# Patient Record
Sex: Male | Born: 1985 | Race: Black or African American | Hispanic: No | Marital: Single | Smoking: Never smoker
Health system: Southern US, Community
[De-identification: ages and names within clinical notes are randomized; demographics above are authoritative.]

## PROBLEM LIST (undated history)

## (undated) DIAGNOSIS — F329 Major depressive disorder, single episode, unspecified: Secondary | ICD-10-CM

## (undated) DIAGNOSIS — F32A Depression, unspecified: Secondary | ICD-10-CM

## (undated) HISTORY — DX: Depression, unspecified: F32.A

## (undated) HISTORY — DX: Major depressive disorder, single episode, unspecified: F32.9

---

## 2009-01-24 ENCOUNTER — Emergency Department (HOSPITAL_COMMUNITY): Admission: EM | Admit: 2009-01-24 | Discharge: 2009-01-24 | Payer: Self-pay | Admitting: Emergency Medicine

## 2009-02-24 ENCOUNTER — Emergency Department (HOSPITAL_COMMUNITY): Admission: EM | Admit: 2009-02-24 | Discharge: 2009-02-24 | Payer: Self-pay | Admitting: Emergency Medicine

## 2009-03-04 ENCOUNTER — Emergency Department (HOSPITAL_COMMUNITY): Admission: EM | Admit: 2009-03-04 | Discharge: 2009-03-04 | Payer: Self-pay | Admitting: Emergency Medicine

## 2009-08-03 ENCOUNTER — Emergency Department (HOSPITAL_COMMUNITY): Admission: EM | Admit: 2009-08-03 | Discharge: 2009-08-03 | Payer: Self-pay | Admitting: Family Medicine

## 2009-09-04 ENCOUNTER — Encounter: Admission: RE | Admit: 2009-09-04 | Discharge: 2009-09-04 | Payer: Self-pay | Admitting: Otolaryngology

## 2010-07-01 IMAGING — CT CT ORBIT/TEMPORAL/IAC W/O CM
4 of 6 series · 16 of 30 positions shown, 17 images · IV contrast (agent unspecified)
Comparison: None

CLINICAL DATA: Cholesteatoma on the left.  Hearing loss.

CT TEMPORAL BONES WITHOUT CONTRAST:
TECHNIQUE: Axial and coronal plane CT imaging of the petrous
temporal bones was performed with thin-collimation image
reconstruction.  No intravenous contrast was administered.
Multiplanar CT image reconstructions were also generated.

[Series 4: ax mag left · axial · 0.20mm/px · z∈[+7,+30]mm · 4 of 126 slices shown]
[im 26/126  bone]
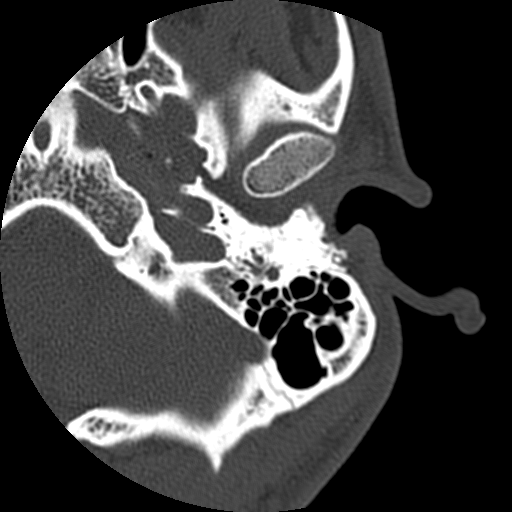
[im 51/126  bone]
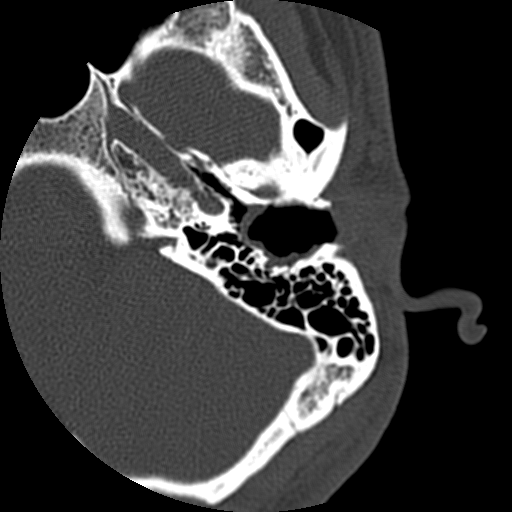
[im 76/126  bone]
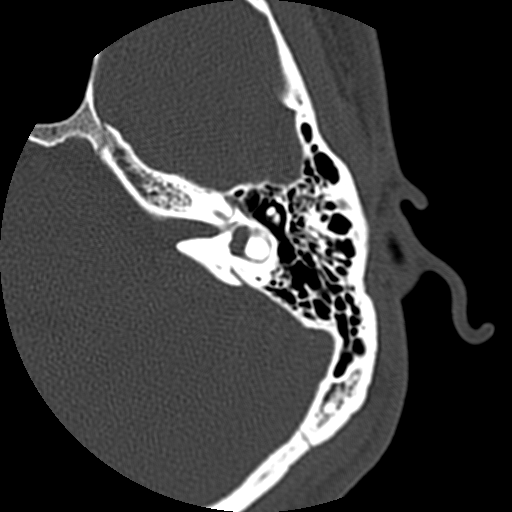
[im 101/126  bone]
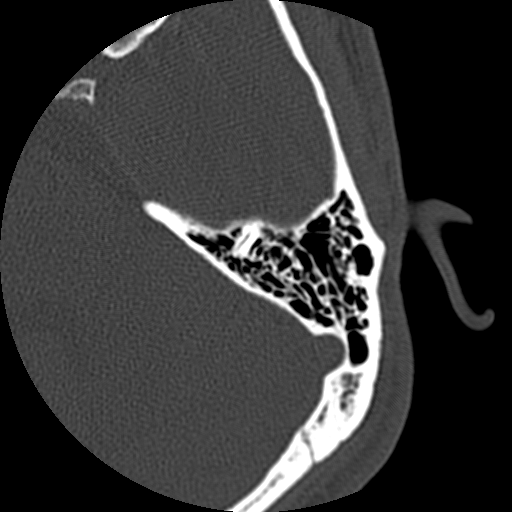

[Series 200: cor bone · coronal · 0.33mm/px · 2 of 81 slices shown, 3 images]
[im 27/81  brain]
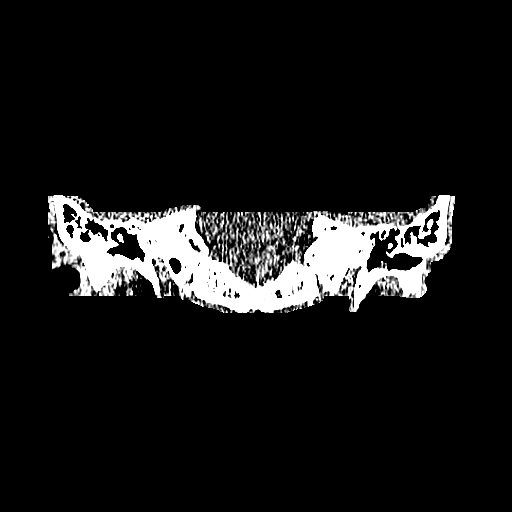
[im 27/81  bone]
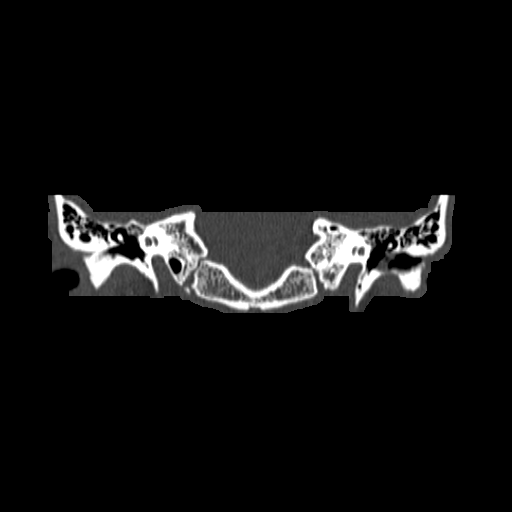
[im 54/81  bone]
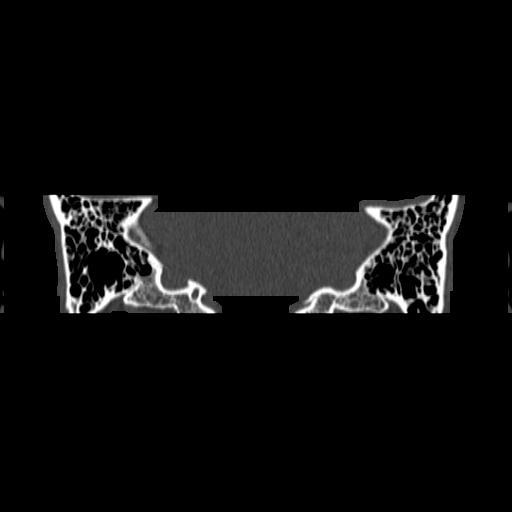

[Series 300: cor mag rt · coronal · 0.20mm/px · 5 of 164 slices shown]
[im 28/164  bone]
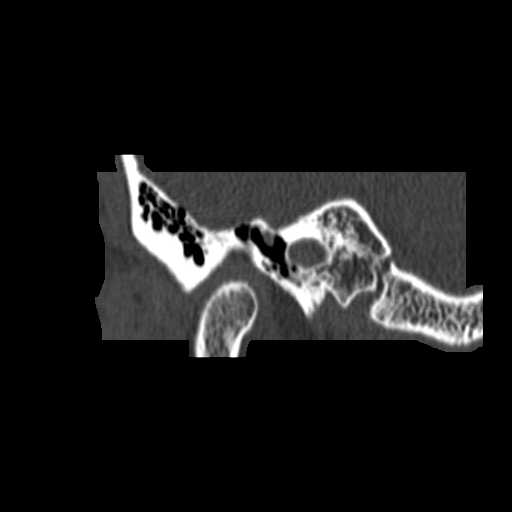
[im 55/164  bone]
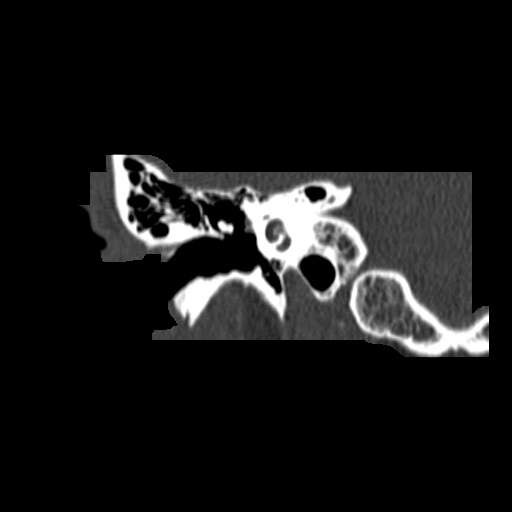
[im 82/164  bone]
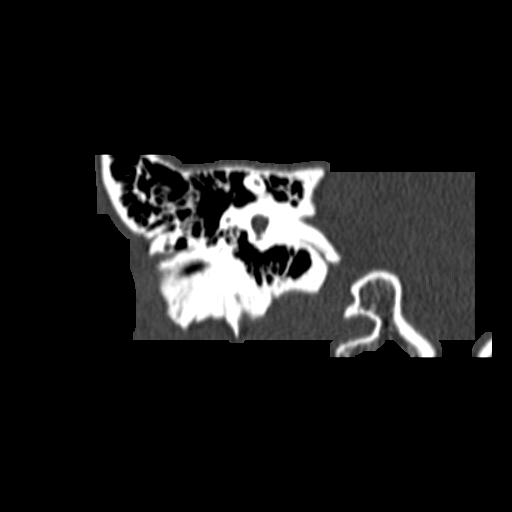
[im 109/164  bone]
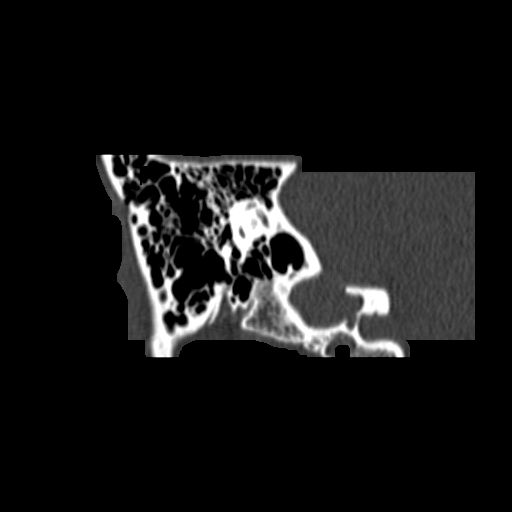
[im 136/164  bone]
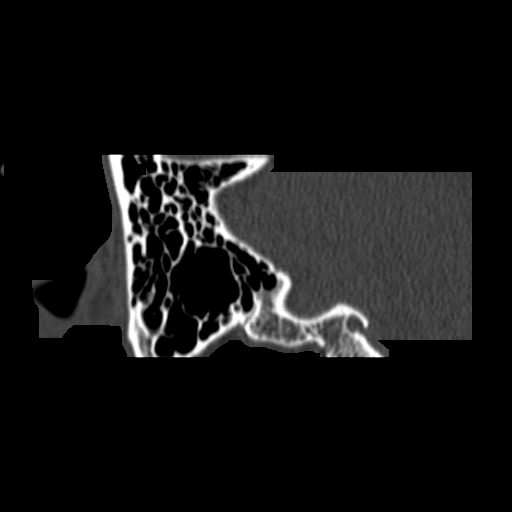

[Series 400: cor mag lt · coronal · 0.20mm/px · 5 of 165 slices shown]
[im 28/165  bone]
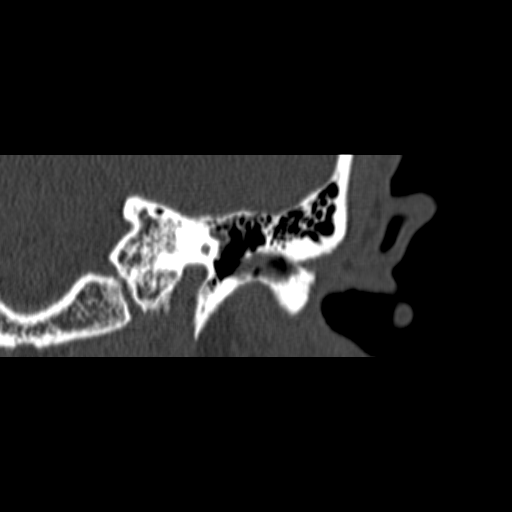
[im 55/165  bone]
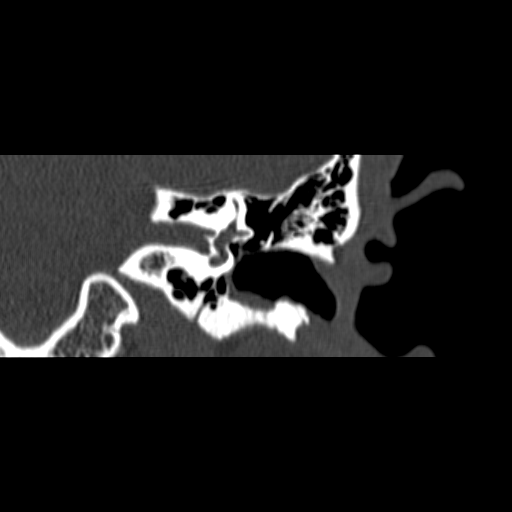
[im 83/165  bone]
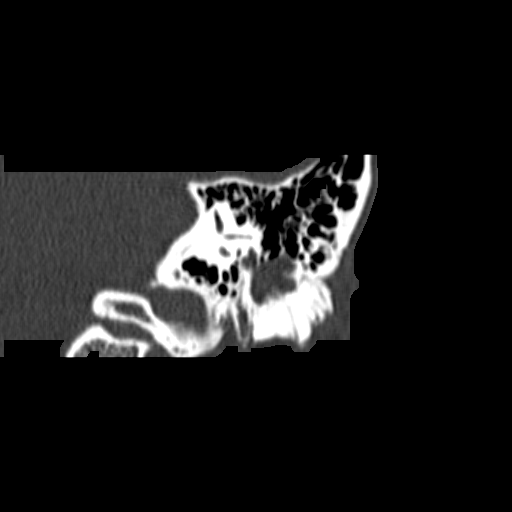
[im 110/165  bone]
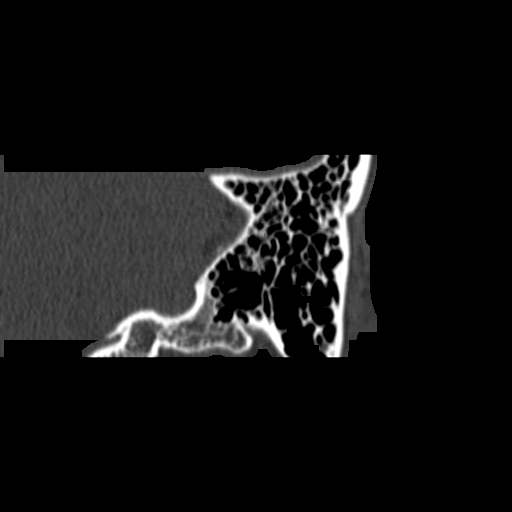
[im 137/165  bone]
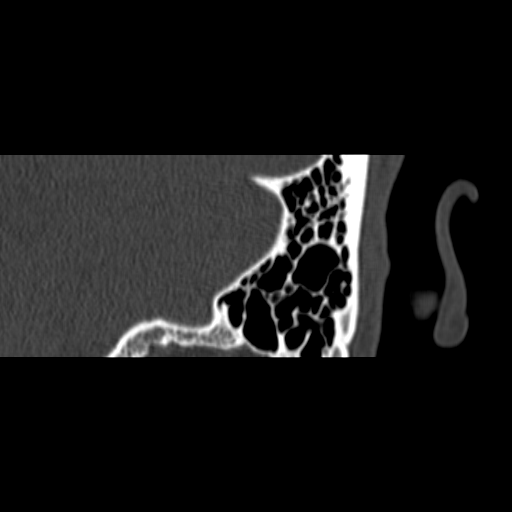

[16 of 30 positions shown; findings below may reference images not displayed]

FINDINGS: On the right, the temporal bone region appears normal.
No fluid in the mastoid air cells.  Middle ear is clear.  Inner ear
structures appear normal.

On the left, the patient has had some sort of extirpation procedure
of the external auditory canal which is circumferentially enlarged
and shows circumferential soft tissue thickening.  There is a
defect in the roof of the temporal fossa.  Tympanic membrane
appears to be intact.  No ossicular destruction.  No fluid in the
middle year or the mastoid air cells.
IMPRESSION: Previous extirpation procedure of the external auditory canal which
is enlarged.  Circumferential soft tissue thickening without
evidence of an actual cholesteatoma or definable polyp.  No fluid
in the middle ear or mastoid air cells.  Bony defect along the
posterior superior wall of the temporal fossa apparently related to
the previous procedure.

## 2010-12-22 LAB — GONOCOCCUS CULTURE

## 2010-12-22 LAB — HERPES SIMPLEX VIRUS CULTURE: Culture: DETECTED

## 2010-12-22 LAB — RAPID STREP SCREEN (MED CTR MEBANE ONLY): Streptococcus, Group A Screen (Direct): NEGATIVE

## 2010-12-22 LAB — CHLAMYDIA TRACHOMATIS CULTURE

## 2013-06-10 ENCOUNTER — Ambulatory Visit (INDEPENDENT_AMBULATORY_CARE_PROVIDER_SITE_OTHER): Payer: BC Managed Care – PPO | Admitting: Emergency Medicine

## 2013-06-10 VITALS — BP 118/74 | HR 70 | Temp 97.9°F | Resp 16 | Ht 67.75 in | Wt 144.6 lb

## 2013-06-10 DIAGNOSIS — Z0000 Encounter for general adult medical examination without abnormal findings: Secondary | ICD-10-CM

## 2013-06-10 LAB — POCT URINALYSIS DIPSTICK
Bilirubin, UA: NEGATIVE
Ketones, UA: NEGATIVE
Leukocytes, UA: NEGATIVE
Nitrite, UA: NEGATIVE
Protein, UA: NEGATIVE

## 2013-06-10 LAB — POCT UA - MICROSCOPIC ONLY
Crystals, Ur, HPF, POC: NEGATIVE
RBC, urine, microscopic: NEGATIVE
WBC, Ur, HPF, POC: NEGATIVE
Yeast, UA: NEGATIVE

## 2013-06-10 NOTE — Progress Notes (Signed)
[facility] [address] [place] [address] [phone]  Date:  06/10/2013   Name:  Brent Lee   DOB:  May 09, 1986   MRN:  [id number]  PCP:  No primary provider on file.    Chief Complaint: Annual Exam   History of Present Illness:  Sylvestre Eguia is a 27 y.o. very pleasant male patient who presents with the following:  For a wellness examination.  Non smoker.  No meds.  Refuses flu shot.  Works for [employer].  Denies other complaint or health concern today.   There are no active problems to display for this patient.   History reviewed. No pertinent past medical history.  History reviewed. No pertinent past surgical history.  History  Substance Use Topics  . Smoking status: Never Smoker   . Smokeless tobacco: Not on file  . Alcohol Use: Yes     Comment: socially    History reviewed. No pertinent family history.  No Known Allergies  Medication list has been reviewed and updated.  No current outpatient prescriptions on file prior to visit.   No current facility-administered medications on file prior to visit.    Review of Systems:  As per HPI, otherwise negative.    Physical Examination: Filed Vitals:   06/10/13 1652  BP: 118/74  Pulse: 70  Temp: 97.9 F (36.6 C)  Resp: 16   Filed Vitals:   06/10/13 1652  Height: 5' 7.75" (1.721 m)  Weight: 144 lb 9.6 oz (65.59 kg)   Body mass index is 22.15 kg/(m^2). Ideal Body Weight: Weight in (lb) to have BMI = 25: 162.9  GEN: WDWN, NAD, Non-toxic, A & O x 3 HEENT: Atraumatic, Normocephalic. Neck supple. No masses, No LAD. Ears and Nose: No external deformity. CV: RRR, No M/G/R. No JVD. No thrill. No extra heart sounds. PULM: CTA B, no wheezes, crackles, rhonchi. No retractions. No resp. distress. No accessory muscle use. ABD: S, NT, ND, +BS. No rebound. No HSM. EXTR: No c/c/e NEURO Normal gait.  PSYCH: Normally interactive. Conversant. Not depressed or anxious appearing.   Calm demeanor.    Assessment and Plan: Wellness examination Labs  Signed,  Phillips Odor, MD

## 2013-06-16 ENCOUNTER — Other Ambulatory Visit (INDEPENDENT_AMBULATORY_CARE_PROVIDER_SITE_OTHER): Payer: BC Managed Care – PPO

## 2013-06-16 DIAGNOSIS — Z0000 Encounter for general adult medical examination without abnormal findings: Secondary | ICD-10-CM

## 2013-06-16 LAB — POCT CBC
HCT, POC: 50.4 % (ref 43.5–53.7)
MCH, POC: 25.8 pg — AB (ref 27–31.2)
MCV: 78.4 fL — AB (ref 80–97)
MID (cbc): 0.6 (ref 0–0.9)
Platelet Count, POC: 206 10*3/uL (ref 142–424)
RBC: 6.43 M/uL — AB (ref 4.69–6.13)
WBC: 5.8 10*3/uL (ref 4.6–10.2)

## 2013-06-16 LAB — COMPREHENSIVE METABOLIC PANEL
AST: 12 U/L (ref 0–37)
Albumin: 4.6 g/dL (ref 3.5–5.2)
BUN: 12 mg/dL (ref 6–23)
CO2: 33 mEq/L — ABNORMAL HIGH (ref 19–32)
Calcium: 10.2 mg/dL (ref 8.4–10.5)
Chloride: 101 mEq/L (ref 96–112)
Creat: 1.22 mg/dL (ref 0.50–1.35)
Glucose, Bld: 82 mg/dL (ref 70–99)
Potassium: 4.2 mEq/L (ref 3.5–5.3)

## 2013-06-16 LAB — LIPID PANEL
Cholesterol: 210 mg/dL — ABNORMAL HIGH (ref 0–200)
HDL: 64 mg/dL (ref >39)
Total CHOL/HDL Ratio: 3.3 Ratio
Triglycerides: 66 mg/dL (ref <150)

## 2013-06-16 NOTE — Progress Notes (Signed)
Patient here for labs only. 

## 2013-06-21 ENCOUNTER — Encounter: Payer: Self-pay | Admitting: Family Medicine

## 2013-06-30 ENCOUNTER — Telehealth: Payer: Self-pay

## 2013-06-30 NOTE — Telephone Encounter (Signed)
Labs look good. Follow up in six months, called patient to advise.

## 2013-06-30 NOTE — Telephone Encounter (Signed)
Patient calling to get lab results. He said he came in for labs on the 06/16/13. He didn't know that we had the wrong phone number on file. His correct number is: [phone] I told him we had tried to reach him over several times and that our office has sent a letter also with his results. Patient understood. Would like someone to call him when they get a chance to get his results. Thank you!  [phone]

## 2013-07-26 ENCOUNTER — Telehealth: Payer: Self-pay

## 2013-07-26 NOTE — Telephone Encounter (Signed)
Patient dropped off paperwork for [employer] Waiver to be completed. Had physical in Sept.

## 2013-11-13 ENCOUNTER — Ambulatory Visit (INDEPENDENT_AMBULATORY_CARE_PROVIDER_SITE_OTHER): Payer: BC Managed Care – PPO | Admitting: Physician Assistant

## 2013-11-13 VITALS — BP 120/68 | HR 70 | Temp 98.0°F | Resp 16 | Ht 68.0 in | Wt 149.0 lb

## 2013-11-13 DIAGNOSIS — H6120 Impacted cerumen, unspecified ear: Secondary | ICD-10-CM

## 2013-11-13 DIAGNOSIS — L659 Nonscarring hair loss, unspecified: Secondary | ICD-10-CM

## 2013-11-13 NOTE — Patient Instructions (Signed)
Place a mineral oil soaked cotton ball in your ear for about 20 minutes once weekly.  This will hopefully help prevent the build up of wax   Cerumen Impaction A cerumen impaction is when the wax in your ear forms a plug. This plug usually causes reduced hearing. Sometimes it also causes an earache or dizziness. Removing a cerumen impaction can be difficult and painful. The wax sticks to the ear canal. The canal is sensitive and bleeds easily. If you try to remove a heavy wax buildup with a cotton tipped swab, you may push it in further. Irrigation with water, suction, and small ear curettes may be used to clear out the wax. If the impaction is fixed to the skin in the ear canal, ear drops may be needed for a few days to loosen the wax. People who build up a lot of wax frequently can use ear wax removal products available in your local drugstore. SEEK MEDICAL CARE IF:  You develop an earache, increased hearing loss, or marked dizziness. Document Released: 10/08/2004 Document Revised: 11/23/2011 Document Reviewed: 11/28/2009 [facility] Patient Information 2014 [employer], [place].

## 2013-11-13 NOTE — Progress Notes (Signed)
   Subjective:    Patient ID: Brent Lee, male    DOB: 11-07-1985, 28 y.o.   MRN: [id number]  HPI   Brent Lee is a very pleasant 28 yr old male here with two concerns:  (1) Left ear - cannot hear well.  Denies pain.  Thinks full of wax.  Has had problems with cerumen in the past.  This has been going on for several weeks, maybe a month.  He denies drainage.  No known FB to the ear.  Right ear feels ok.  He tried otc drops but no relief.    (2) Concern for recent hair loss - reports thinning of hair on top of his head.  Has noticed for the few months.  Would like referral for dermatology.  He denies any other associated symptoms.  No other hair loss.  No family history of baldness.     Review of Systems  Constitutional: Negative for fever and chills.  HENT: Positive for hearing loss (left ear). Negative for ear discharge and ear pain.   Respiratory: Negative.   Cardiovascular: Negative.   Gastrointestinal: Negative.   Musculoskeletal: Negative.   Skin: Negative for rash and wound.       Objective:   Physical Exam  Vitals reviewed. Constitutional: He is oriented to person, place, and time. He appears well-developed and well-nourished. No distress.  HENT:  Head: Normocephalic and atraumatic. Hair is abnormal (thinning centrally).    Right Ear: Tympanic membrane, external ear and ear canal normal.  Left ear canal completely occluded with cerumen, unable to even insert ear speculum  Eyes: Conjunctivae are normal. No scleral icterus.  Pulmonary/Chest: Effort normal.  Neurological: He is alert and oriented to person, place, and time.  Skin: Skin is warm and dry.  Psychiatric: He has a normal mood and affect. His behavior is normal.     Left ear irrigated, which successfully removed cerumen impaction.  Further removal of cerumen/debris by instrumentation.  Canal is patent.  TM intact.  Improvement in symptoms     Assessment & Plan:  1. Cerumen impaction Brent Lee is a  very pleasant 28 yr old male with Left cerumen impaction.  The ear was irrigated and cerumen was removed by instrumentation as well.  Canal is now patent.  Subjective improvement in symptoms.  Discussed strategies for reducing cerumen impaction in the future.  2. 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  Pt requests ref to derm, which I have done.  Follow up here if needed. - Ambulatory referral to Dermatology   Pt to call or RTC if worsening or not improving  Brent Lee MHS, PA-C [facility] 3/2/20155:58 PM

## 2015-07-02 ENCOUNTER — Ambulatory Visit (INDEPENDENT_AMBULATORY_CARE_PROVIDER_SITE_OTHER): Payer: BLUE CROSS/BLUE SHIELD | Admitting: Emergency Medicine

## 2015-07-02 VITALS — BP 112/80 | HR 75 | Temp 98.5°F | Resp 18 | Ht 68.0 in | Wt 161.0 lb

## 2015-07-02 DIAGNOSIS — L659 Nonscarring hair loss, unspecified: Secondary | ICD-10-CM | POA: Diagnosis not present

## 2015-07-02 DIAGNOSIS — Z0000 Encounter for general adult medical examination without abnormal findings: Secondary | ICD-10-CM | POA: Diagnosis not present

## 2015-07-02 DIAGNOSIS — Z202 Contact with and (suspected) exposure to infections with a predominantly sexual mode of transmission: Secondary | ICD-10-CM

## 2015-07-02 LAB — CBC
HCT: 46 % (ref 39.0–52.0)
HEMOGLOBIN: 16.6 g/dL (ref 13.0–17.0)
MCH: 25.2 pg — AB (ref 26.0–34.0)
MCHC: 36.1 g/dL — ABNORMAL HIGH (ref 30.0–36.0)
MCV: 69.7 fL — ABNORMAL LOW (ref 78.0–100.0)
PLATELETS: 226 10*3/uL (ref 150–400)
RBC: 6.6 MIL/uL — AB (ref 4.22–5.81)
RDW: 14.5 % (ref 11.5–15.5)
WBC: 6 10*3/uL (ref 4.0–10.5)

## 2015-07-02 LAB — LIPID PANEL
CHOL/HDL RATIO: 3.3 ratio (ref <=5.0)
CHOLESTEROL: 234 mg/dL — AB (ref 125–200)
HDL: 70 mg/dL (ref >=40)
LDL CALC: 143 mg/dL — AB (ref <130)
Triglycerides: 105 mg/dL (ref <150)
VLDL: 21 mg/dL (ref <30)

## 2015-07-02 LAB — COMPREHENSIVE METABOLIC PANEL
ALBUMIN: 4.9 g/dL (ref 3.6–5.1)
ALT: 20 U/L (ref 9–46)
AST: 17 U/L (ref 10–40)
Alkaline Phosphatase: 53 U/L (ref 40–115)
BILIRUBIN TOTAL: 0.5 mg/dL (ref 0.2–1.2)
BUN: 11 mg/dL (ref 7–25)
CHLORIDE: 99 mmol/L (ref 98–110)
CO2: 29 mmol/L (ref 20–31)
CREATININE: 1.07 mg/dL (ref 0.60–1.35)
Calcium: 10 mg/dL (ref 8.6–10.3)
Glucose, Bld: 91 mg/dL (ref 65–99)
Potassium: 4.6 mmol/L (ref 3.5–5.3)
SODIUM: 138 mmol/L (ref 135–146)
TOTAL PROTEIN: 8.1 g/dL (ref 6.1–8.1)

## 2015-07-02 NOTE — Patient Instructions (Signed)
Alopecia Areata  Alopecia areata is a type of hair loss. If you have this condition, you may lose hair on your scalp in patches. In some cases, you may lose all the hair on your scalp (alopecia totalis) or all the hair from your face and body (alopecia universalis).   Alopecia areata is an autoimmune disease. This means your body's defense system (immune system) mistakes normal parts of your body for germs or other things that can make you sick. When you have alopecia areata, your immune system attacks your hair follicles.   Alopecia areata often starts during childhood but can occur at any age. Alopecia areata is not a danger to your health but can be stressful.   CAUSES   The cause of alopecia areata is unknown.   RISK FACTORS  You may be at higher risk of alopecia areata if you:   · Have a family history of alopecia.  · Have a family history of another autoimmune disease, including type 1 diabetes and rheumatoid arthritis.  SIGNS AND SYMPTOMS  Signs of alopecia areata may include:  · Loss of scalp hair in small, round patches. These may be about the size of a quarter.  · Loss of all hair on your scalp.  · Loss of eyebrow hair, facial hair, or the hair inside your nose (nasal hair).  · Hair loss over your entire body.  DIAGNOSIS   Alopecia areata may be diagnosed by:  · Medical history and physical exam.  · Taking a sample of hair to check under a microscope.  · Taking a small piece of skin (biopsy) to examine under a microscope.  · Blood tests to rule out other autoimmune diseases.  TREATMENT   There is no cure for alopecia areata, but the disease often goes away over time. You will not lose the ability to regrow hair. Some medicines may help your hair regrow more quickly. These include:  · Corticosteroids. These block inflammation caused by your immune system. You may get this medicine as a lotion for your skin or as an injection.  · Minoxidil. This is a hair growth medicine you can use in areas of hair  loss.  · Anthralin. This is a medicine for a skin inflammation called psoriasis that may also help alopecia.  · Diphencyprone. This medicine is applied to your skin and may stimulate hair growth.  HOME CARE INSTRUCTIONS  · Use sunscreen or cover your head when outdoors.  · Take medicines only as directed by your health care provider.  · If you have lost your eyebrows, wear sunglasses outside to keep dust out of your eyes.  · If you have lost hair inside your nose, wear a kerchief over your face or apply ointment to the inside of your nose. This keeps out dust and other irritants.  · Keep all follow-up visits as directed by your health care provider. This is important.  SEEK MEDICAL CARE IF:  · Your symptoms change.  · You have new symptoms.  · You have a reaction to your medicines.  · You are struggling emotionally.     This information is not intended to replace advice given to you by your health care provider. Make sure you discuss any questions you have with your health care provider.     Document Released: 04/04/2004 Document Revised: 09/21/2014 Document Reviewed: 11/20/2013  Elsevier Interactive Patient Education ©2016 Elsevier Inc.

## 2015-07-02 NOTE — Progress Notes (Signed)
Subjective:  Patient ID: Cathlean SauerDwayne Peabody, male    DOB: 02-Nov-1985  Age: 29 y.o. MRN: [id number]  CC: Annual Exam and Referral   HPI Malone Mayford KnifeWilliams presents  for an annual physical. 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 He is also requesting evaluation for STD exposure. He is asymptomatic  History Debra has no past medical history on file.   He has no past surgical history on file.   His  family history is not on file.  He   reports that he has never smoked. He does not have any smokeless tobacco history on file. He reports that he drinks alcohol. He reports that he does not use illicit drugs.  No outpatient prescriptions prior to visit.   No facility-administered medications prior to visit.    Social History   Social History  . Marital Status: Single    Spouse Name: N/A  . Number of Children: N/A  . Years of Education: N/A   Social History Main Topics  . Smoking status: Never Smoker   . Smokeless tobacco: None  . Alcohol Use: Yes     Comment: socially  . Drug Use: No  . Sexual Activity: Yes   Other Topics Concern  . None   Social History Narrative     Review of Systems  Constitutional: Negative for fever, chills and appetite change.  HENT: Negative for congestion, ear pain, postnasal drip, sinus pressure and sore throat.   Eyes: Negative for pain and redness.  Respiratory: Negative for cough, shortness of breath and wheezing.   Cardiovascular: Negative for leg swelling.  Gastrointestinal: Negative for nausea, vomiting, abdominal pain, diarrhea, constipation and blood in stool.  Endocrine: Negative for polyuria.  Genitourinary: Negative for dysuria, urgency, frequency and flank pain.  Musculoskeletal: Negative for gait problem.  Skin: Negative for rash.  Neurological: Negative for weakness and headaches.  Psychiatric/Behavioral: Negative for confusion and decreased concentration. The patient is not  nervous/anxious.     Objective:  BP 112/80 mmHg  Pulse 75  Temp(Src) 98.5 F (36.9 C) (Oral)  Resp 18  Ht 5\' 8"  (1.727 m)  Wt 161 lb (73.029 kg)  BMI 24.49 kg/m2  SpO2 98%  Physical Exam  Constitutional: He is oriented to person, place, and time. He appears well-developed and well-nourished. No distress.  HENT:  Head: Normocephalic and atraumatic.  Right Ear: External ear normal.  Left Ear: External ear normal.  Nose: Nose normal.  Eyes: Conjunctivae and EOM are normal. Pupils are equal, round, and reactive to light. No scleral icterus.  Neck: Normal range of motion. Neck supple. No tracheal deviation present.  Cardiovascular: Normal rate, regular rhythm and normal heart sounds.   Pulmonary/Chest: Effort normal. No respiratory distress. He has no wheezes. He has no rales.  Abdominal: He exhibits no mass. There is no tenderness. There is no rebound and no guarding.  Musculoskeletal: He exhibits no edema.  Lymphadenopathy:    He has no cervical adenopathy.  Neurological: He is alert and oriented to person, place, and time. Coordination normal.  Skin: Skin is warm and dry. No rash noted.  Psychiatric: He has a normal mood and affect. His behavior is normal.      Assessment & Plan:   Nichlos was seen today for annual exam and referral.  Diagnoses and all orders for this visit:  Annual physical exam -     CBC -     Comprehensive metabolic panel -  GC/Chlamydia Probe Amp -     HSV(herpes simplex vrs) 1+2 ab-IgG -     HIV antibody -     RPR -     Lipid panel  STD exposure -     CBC -     Comprehensive metabolic panel -     GC/Chlamydia Probe Amp -     HSV(herpes simplex vrs) 1+2 ab-IgG -     HIV antibody -     RPR -     Lipid panel  Alopecia -     Ambulatory referral to Dermatology  Mr. Kestler does not currently have medications on file.  No orders of the defined types were placed in this encounter.    Appropriate red flag conditions were discussed  with the patient as well as actions that should be taken.  Patient expressed his understanding.  Follow-up: Return if symptoms worsen or fail to improve.  Carmelina Dane, MD

## 2015-07-03 ENCOUNTER — Other Ambulatory Visit: Payer: Self-pay | Admitting: Emergency Medicine

## 2015-07-03 LAB — GC/CHLAMYDIA PROBE AMP
CT Probe RNA: NEGATIVE
GC Probe RNA: NEGATIVE

## 2015-07-03 LAB — HSV(HERPES SIMPLEX VRS) I + II AB-IGG: HSV 2 GLYCOPROTEIN G AB, IGG: 7.23 IV — AB

## 2015-07-03 LAB — RPR

## 2015-07-03 LAB — HIV ANTIBODY (ROUTINE TESTING W REFLEX): HIV 1&2 Ab, 4th Generation: NONREACTIVE

## 2015-07-03 MED ORDER — VALACYCLOVIR HCL 500 MG PO TABS: 500.0000 mg | ORAL_TABLET | Freq: Every day | ORAL | Status: DC

## 2015-07-03 MED ORDER — VALACYCLOVIR HCL 1 G PO TABS: 1000.0000 mg | ORAL_TABLET | Freq: Two times a day (BID) | ORAL | Status: DC

## 2015-12-03 ENCOUNTER — Ambulatory Visit (INDEPENDENT_AMBULATORY_CARE_PROVIDER_SITE_OTHER): Payer: BLUE CROSS/BLUE SHIELD | Admitting: Family Medicine

## 2015-12-03 VITALS — BP 110/68 | HR 74 | Temp 98.7°F | Resp 20 | Ht 68.0 in | Wt 163.4 lb

## 2015-12-03 DIAGNOSIS — G44229 Chronic tension-type headache, not intractable: Secondary | ICD-10-CM | POA: Diagnosis not present

## 2015-12-03 MED ORDER — IBUPROFEN 800 MG PO TABS: 800.0000 mg | ORAL_TABLET | Freq: Four times a day (QID) | ORAL | Status: DC | PRN

## 2015-12-03 NOTE — Patient Instructions (Signed)
     IF you received an x-ray today, you will receive an invoice from [facility]. Please contact [facility] at [phone] with questions or concerns regarding your invoice.   IF you received labwork today, you will receive an invoice from [facility]. Please contact [facility] at [phone] with questions or concerns regarding your invoice.   Our billing staff will not be able to assist you with questions regarding bills from these companies.  You will be contacted with the lab results as soon as they are available. The fastest way to get your results is to activate your My Chart account. Instructions are located on the last page of this paperwork. If you have not heard from us regarding the results in 2 weeks, please contact this office.      

## 2015-12-03 NOTE — Progress Notes (Signed)
  Subjective:    Brent Lee is a 30 y.o. male who presents for evaluation of headache. Symptoms began about 7 days ago. Generally, the headaches last about a few hours and occur at end of work.  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 The headaches worse with computer screen time. The headaches are usually dull and are located in temporal B?L.  The patient rates his most severe headaches a 5 on a scale from 1 to 10. Recently, the headaches have been stable. Work attendance or other daily activities are affected by the headaches. Precipitating factors include: none which have been determined. The headaches are usually not preceded by an aura. Associated neurologic symptoms: None. The patient denies worst HA of life, diplopia, blurred vision, dysphagia, muscle weakness, aura. Home treatment has included ibuprofen with little improvement. Other history includes: nothing pertinent. Family history includes no known family members with significant headaches.  The following portions of the patient's history were reviewed and updated as appropriate: allergies, current medications, past family history, past medical history, past social history, past surgical history and problem list.  Review of Systems Pertinent items are noted in HPI.    Objective:    BP 110/68 mmHg  Pulse 74  Temp(Src) 98.7 F (37.1 C) (Oral)  Resp 20  Ht 5\' 8"  (1.727 m)  Wt 163 lb 6.4 oz (74.118 kg)  BMI 24.85 kg/m2  SpO2 94% General appearance: alert, cooperative and appears stated age Head: Normocephalic, without obvious abnormality, atraumatic Chest wall: no tenderness Heart: regular rate and rhythm Neurologic: Alert and oriented X 3, normal strength and tone. Normal symmetric reflexes. Normal coordination and gait Cranial nerves: normal    Assessment:    Tension-type headache, episodic    Plan:    Continue present treatment and plan. Side effect profile discussed in  detail. Asked to keep headache diary. Patient reassured that neurodiagnostic workup not indicated from benign H&P.

## 2018-02-14 ENCOUNTER — Ambulatory Visit (INDEPENDENT_AMBULATORY_CARE_PROVIDER_SITE_OTHER): Payer: BLUE CROSS/BLUE SHIELD | Admitting: Urgent Care

## 2018-02-14 ENCOUNTER — Encounter: Payer: Self-pay | Admitting: Urgent Care

## 2018-02-14 VITALS — BP 120/80 | HR 91 | Temp 98.2°F | Resp 18 | Ht 68.0 in | Wt 176.3 lb

## 2018-02-14 DIAGNOSIS — F4321 Adjustment disorder with depressed mood: Secondary | ICD-10-CM | POA: Diagnosis not present

## 2018-02-14 DIAGNOSIS — F4541 Pain disorder exclusively related to psychological factors: Secondary | ICD-10-CM

## 2018-02-14 DIAGNOSIS — Z634 Disappearance and death of family member: Secondary | ICD-10-CM

## 2018-02-14 MED ORDER — FLUOXETINE HCL 20 MG PO TABS: 20.0000 mg | ORAL_TABLET | Freq: Every day | ORAL | 0 refills | Status: DC

## 2018-02-14 MED ORDER — ALPRAZOLAM 0.25 MG PO TABS: 0.2500 mg | ORAL_TABLET | Freq: Two times a day (BID) | ORAL | 0 refills | Status: DC | PRN

## 2018-02-14 NOTE — Progress Notes (Signed)
   MRN: [id number] DOB: [birth year]  Subjective:   Brent Lee is a 32 y.o. male presenting for 3 week history of persistent depressed mood, difficulty coping with his mother's death.  Patient reports that she passed away from breast cancer with metastases at age 32 on 01/25/2018.  Reports that he is having difficulty with his sleep, energy and appetite.  He states that he is trying to take care of himself still and is able to eat regularly and practice good hygiene.  He has been taking time from work for grieving since the 14th.  He would like help with FMLA for this given how hard of the time he is having coping with his mother's death.  Reports he was very close to her.  He does work in a very stressful environment with [employer] reservations.  Denies SI, [place].  He has never previously taken any medications for depression.  He drinks alcohol on the weekends only.  [name] is not currently taking any medications. Also has No Known Allergies.  Arley denies past medical history. Denies past surgical history.  Objective:   Vitals: BP 120/80   Pulse 91   Temp 98.2 F (36.8 C) (Oral)   Resp 18   Ht 5\' 8"  (1.727 m)   Wt 176 lb 5.1 oz (80 kg)   SpO2 98%   BMI 26.81 kg/m   Physical Exam  Constitutional: He is oriented to person, place, and time. He appears well-developed and well-nourished.  Cardiovascular: Normal rate.  Pulmonary/Chest: Effort normal.  Neurological: He is alert and oriented to person, place, and time.  Psychiatric: His mood appears not anxious. His speech is not rapid and/or pressured. He exhibits a depressed mood (due to death of his mother). He expresses no homicidal and no suicidal ideation.   Assessment and Plan :   Grieving  Death of family member  Stress headaches  Patient will start Prozac with Xanax as bridge therapy.  He is going to get in touch with a therapist as well to help with his grieving.  I am happy to help with his FMLA, patient states he  will give me any paperwork for his job needs. Follow up in 6 weeks.  Wallis BambergMario Amareon Phung, PA-C [facility] [phone] 02/14/2018  4:02 PM

## 2018-02-14 NOTE — Patient Instructions (Addendum)
[facility] [address] [place], [place] [address]   Shanon Rosser [phone]  Maris Berger [phone]  Marco Collie [phone]   [facility] ([address] Hulan Amato Beckey Rutter Joycelyn Schmid [place]) - [phone]  [facility] [place]) - [phone]  [facility] - [phone]  [facility] - [phone]  Buena Irish - [phone]  [facility]  - [phone] (do not file insurance)      Complicated Grieving Grief is a normal response to the death of someone close to you. Feelings of fear, anger, and guilt can affect almost everyone who loses a loved one. It is also common to have symptoms of depression while you are grieving. These include problems with sleep, loss of appetite, and lack of energy. They may last for weeks or months after a loss. Complicated grief is different from normal grief or depression. Normal grieving involves sadness and feelings of loss, but these feelings are not constant. Complicated grief is a constant and severe type of grief. It interferes with your ability to function normally. It may last for several months to a year or longer. Complicated grief may require treatment from a mental health care provider. What are the causes? It is not known why some people continue to struggle with grief and others do not. You may be at higher risk for complicated grief if:  The death of your loved one was sudden or unexpected.  The death of your loved one was due to a violent event.  Your loved one committed suicide.  Your loved one was a child or a young person.  You were very close to or dependent on the loved one.  You have a history of depression.  What are the signs or symptoms? Signs and symptoms of complicated grief may include:  Feeling disbelief or numbness.  Being unable to enjoy good memories of  your loved one.  Needing to avoid anything that reminds you of your loved one.  Being unable to stop thinking about the death.  Feeling intense anger or guilt.  Feeling alone and hopeless.  Feeling that your life is meaningless and empty.  Losing the desire to live.  How is this diagnosed? Your health care provider may diagnose complicated grief if:  You have constant symptoms of grief for 6-12 months or longer.  Your symptoms are interfering with your ability to live your life.  Your health care provider may want you to see a mental health care provider. Many symptoms of depression are similar to the symptoms of complicated grief. It is important to be evaluated for complicated grief along with other mental health conditions. How is this treated? Talk therapy with a mental health provider is the most common treatment for complicated grief. During therapy, you will learn healthy ways to cope with the loss of your loved one. In some cases, your mental health care provider may also recommend antidepressant medicines. Follow these instructions at home:  Take care of yourself. ? Eat regular meals and maintain a healthy diet. Eat plenty of fruits, vegetables, and whole grains. ? Try to get some exercise each day. ? Keep regular hours for sleep. Try to get at least 8 hours of sleep each night.  Do not use drugs or alcohol to ease your symptoms.  Take medicines only as directed by your health care provider.  Spend time with friends and loved ones.  Consider joining a grief (bereavement) support group to help you  deal with your loss.  Keep all follow-up visits as directed by your health care provider. This is important. Contact a health care provider if:  Your symptoms keep you from functioning normally.  Your symptoms do not get better with treatment. Get help right away if:  You have serious thoughts of hurting yourself or someone else.  You have suicidal feelings. This  information is not intended to replace advice given to you by your health care provider. Make sure you discuss any questions you have with your health care provider. Document Released: 08/31/2005 Document Revised: 02/06/2016 Document Reviewed: 02/08/2014 Elsevier Interactive Patient Education  2018 [employer].    Fluoxetine capsules or tablets (Depression/Mood Disorders) What is this medicine? FLUOXETINE (floo OX e teen) belongs to a class of drugs known as selective serotonin reuptake inhibitors (SSRIs). It helps to treat mood problems such as depression, obsessive compulsive disorder, and panic attacks. It can also treat certain eating disorders. This medicine may be used for other purposes; ask your health care provider or pharmacist if you have questions. COMMON BRAND NAME(S): Prozac What should I tell my health care provider before I take this medicine? They need to know if you have any of these conditions: -bipolar disorder or a family history of bipolar disorder -bleeding disorders -glaucoma -heart disease -liver disease -low levels of sodium in the blood -seizures -suicidal thoughts, plans, or attempt; a previous suicide attempt by you or a family member -take MAOIs like Carbex, Eldepryl, Marplan, Nardil, and Parnate -take medicines that treat or prevent blood clots -thyroid disease -an unusual or allergic reaction to fluoxetine, other medicines, foods, dyes, or preservatives -pregnant or trying to get pregnant -breast-feeding How should I use this medicine? Take this medicine by mouth with a glass of water. Follow the directions on the prescription label. You can take this medicine with or without food. Take your medicine at regular intervals. Do not take it more often than directed. Do not stop taking this medicine suddenly except upon the advice of your doctor. Stopping this medicine too quickly may cause serious side effects or your condition may worsen. A special MedGuide  will be given to you by the pharmacist with each prescription and refill. Be sure to read this information carefully each time. Talk to your pediatrician regarding the use of this medicine in children. While this drug may be prescribed for children as young as 7 years for selected conditions, precautions do apply. Overdosage: If you think you have taken too much of this medicine contact a poison control center or emergency room at once. NOTE: This medicine is only for you. Do not share this medicine with others. What if I miss a dose? If you miss a dose, skip the missed dose and go back to your regular dosing schedule. Do not take double or extra doses. What may interact with this medicine? Do not take this medicine with any of the following medications: -other medicines containing fluoxetine, like Sarafem or Symbyax -cisapride -linezolid -MAOIs like Carbex, Eldepryl, Marplan, Nardil, and Parnate -methylene blue (injected into a vein) -pimozide -thioridazine This medicine may also interact with the following medications: -alcohol -amphetamines -aspirin and aspirin-like medicines -carbamazepine -certain medicines for depression, anxiety, or psychotic disturbances -certain medicines for migraine headaches like almotriptan, eletriptan, frovatriptan, naratriptan, rizatriptan, sumatriptan, zolmitriptan -digoxin -diuretics -fentanyl -flecainide -furazolidone -isoniazid -lithium -medicines for sleep -medicines that treat or prevent blood clots like warfarin, enoxaparin, and dalteparin -NSAIDs, medicines for pain and inflammation, like ibuprofen or naproxen -phenytoin -procarbazine -  propafenone -rasagiline -ritonavir -supplements like St. John's wort, kava kava, valerian -tramadol -tryptophan -vinblastine This list may not describe all possible interactions. Give your health care provider a list of all the medicines, herbs, non-prescription drugs, or dietary supplements you use. Also  tell them if you smoke, drink alcohol, or use illegal drugs. Some items may interact with your medicine. What should I watch for while using this medicine? Tell your doctor if your symptoms do not get better or if they get worse. Visit your doctor or health care professional for regular checks on your progress. Because it may take several weeks to see the full effects of this medicine, it is important to continue your treatment as prescribed by your doctor. Patients and their families should watch out for new or worsening thoughts of suicide or depression. Also watch out for sudden changes in feelings such as feeling anxious, agitated, panicky, irritable, hostile, aggressive, impulsive, severely restless, overly excited and hyperactive, or not being able to sleep. If this happens, especially at the beginning of treatment or after a change in dose, call your health care professional. Bonita QuinYou may get drowsy or dizzy. Do not drive, use machinery, or do anything that needs mental alertness until you know how this medicine affects you. Do not stand or sit up quickly, especially if you are an older patient. This reduces the risk of dizzy or fainting spells. Alcohol may interfere with the effect of this medicine. Avoid alcoholic drinks. Your mouth may get dry. Chewing sugarless gum or sucking hard candy, and drinking plenty of water may help. Contact your doctor if the problem does not go away or is severe. This medicine may affect blood sugar levels. If you have diabetes, check with your doctor or health care professional before you change your diet or the dose of your diabetic medicine. What side effects may I notice from receiving this medicine? Side effects that you should report to your doctor or health care professional as soon as possible: -allergic reactions like skin rash, itching or hives, swelling of the face, lips, or tongue -anxious -black, tarry stools -breathing problems -changes in  vision -confusion -elevated mood, decreased need for sleep, racing thoughts, impulsive behavior -eye pain -fast, irregular heartbeat -feeling faint or lightheaded, falls -feeling agitated, angry, or irritable -hallucination, loss of contact with reality -loss of balance or coordination -loss of memory -painful or prolonged erections -restlessness, pacing, inability to keep still -seizures -stiff muscles -suicidal thoughts or other mood changes -trouble sleeping -unusual bleeding or bruising -unusually weak or tired -vomiting Side effects that usually do not require medical attention (report to your doctor or health care professional if they continue or are bothersome): -change in appetite or weight -change in sex drive or performance -diarrhea -dry mouth -headache -increased sweating -nausea -tremors This list may not describe all possible side effects. Call your doctor for medical advice about side effects. You may report side effects to FDA at 1-800-FDA-1088. Where should I keep my medicine? Keep out of the reach of children. Store at room temperature between 15 and 30 degrees C (59 and 86 degrees F). Throw away any unused medicine after the expiration date. NOTE: This sheet is a summary. It may not cover all possible information. If you have questions about this medicine, talk to your doctor, pharmacist, or health care provider.  2018 Elsevier/Gold Standard (2016-02-01 15:55:27)     IF you received an x-ray today, you will receive an invoice from [facility]. Please contact [facility] at  [phone] with questions or concerns regarding your invoice.   IF you received labwork today, you will receive an invoice from [facility]. Please contact [facility] at [phone] with questions or concerns regarding your invoice.   Our billing staff will not be able to assist you with questions regarding bills from these companies.  You will be contacted with the  lab results as soon as they are available. The fastest way to get your results is to activate your My Chart account. Instructions are located on the last page of this paperwork. If you have not heard from [place] regarding the results in 2 weeks, please contact this office.

## 2018-02-15 ENCOUNTER — Telehealth: Payer: Self-pay | Admitting: Urgent Care

## 2018-02-15 NOTE — Telephone Encounter (Signed)
Patient needs FMLA forms completed by [facility] for his last OV for grief of his mothers passing. I have completed the forms based off the OV notes and with talking to the patient, I will place the forms in Mani's box to be signed on 02/15/18 please return to the FMLA/Disability box at the 102 checkout desk within 5-7 business days. Thank you!

## 2018-02-16 NOTE — Telephone Encounter (Signed)
Thank you so much for pre-filling the form, Luther ParodyCaitlin! Excellent work! Form returned to the [facility].

## 2018-02-21 NOTE — Telephone Encounter (Signed)
Pt calling to see if FMLA paperwork was completed.  Pt thought he needs to pick up. Please advise

## 2018-02-21 NOTE — Telephone Encounter (Signed)
Paperwork completed and faxed to the number on the forms

## 2018-03-01 ENCOUNTER — Telehealth: Payer: Self-pay | Admitting: Urgent Care

## 2018-03-01 NOTE — Telephone Encounter (Signed)
Copied from CRM [phone]. Topic: Quick Communication - See Telephone Encounter >> Mar 01, 2018 10:16 AM Jolayne Hainesaylor, Brittany L wrote: CRM for notification. See Telephone encounter for: 03/01/18.  Patient states that he saw Wallis BambergMario Mani on 6/3 and he told him if he needed more time to adjust to his medication to call the office and he would give him a note for work. He said he will not be at work any this week. He said he will return Monday 6/24.  Call when ready for pick up [phone]

## 2018-03-01 NOTE — Telephone Encounter (Signed)
If it is a work note he needs, this is okay. Just have him tell you the dates complete it for him. Thank you!

## 2018-03-02 NOTE — Telephone Encounter (Signed)
Spoke to pt and wrote letter for out of work through 03/06/2018.  RTW 03/07/2018 Pt will pick up at 102 office.  Left up front

## 2018-03-16 ENCOUNTER — Telehealth: Payer: Self-pay

## 2018-03-16 NOTE — Telephone Encounter (Signed)
Copied from CRM [phone]. Topic: General - Other >> Mar 16, 2018  3:31 PM Gaynelle AduPoole, Shalonda wrote: Reason for EAV:[id number]:patient called and stated he is needing his FMLA paperwork faxed to  [phone]. He is also requesting for someone to call back in  regards to the paperwork. Please advise   Message to [facility] Gilbert[facility]aitlin

## 2018-03-18 NOTE — Telephone Encounter (Signed)
Records were faxed to [phone] on 03/09/18 at 10:10am I will resend them again today 03/18/18

## 2018-03-24 ENCOUNTER — Ambulatory Visit (INDEPENDENT_AMBULATORY_CARE_PROVIDER_SITE_OTHER): Payer: BLUE CROSS/BLUE SHIELD | Admitting: Urgent Care

## 2018-03-24 ENCOUNTER — Encounter: Payer: Self-pay | Admitting: Urgent Care

## 2018-03-24 ENCOUNTER — Other Ambulatory Visit: Payer: Self-pay

## 2018-03-24 VITALS — BP 109/73 | HR 61 | Temp 99.1°F | Resp 16 | Ht 69.0 in | Wt 170.0 lb

## 2018-03-24 DIAGNOSIS — F4321 Adjustment disorder with depressed mood: Secondary | ICD-10-CM | POA: Diagnosis not present

## 2018-03-24 DIAGNOSIS — F32A Depression, unspecified: Secondary | ICD-10-CM

## 2018-03-24 DIAGNOSIS — F329 Major depressive disorder, single episode, unspecified: Secondary | ICD-10-CM | POA: Diagnosis not present

## 2018-03-24 DIAGNOSIS — Z634 Disappearance and death of family member: Secondary | ICD-10-CM | POA: Diagnosis not present

## 2018-03-24 MED ORDER — SERTRALINE HCL 50 MG PO TABS: 50.0000 mg | ORAL_TABLET | Freq: Every day | ORAL | 1 refills | Status: AC

## 2018-03-24 MED ORDER — ALPRAZOLAM 0.25 MG PO TABS: 0.2500 mg | ORAL_TABLET | Freq: Two times a day (BID) | ORAL | 0 refills | Status: AC | PRN

## 2018-03-24 NOTE — Patient Instructions (Addendum)
[facility] [address] [place], [place] [address]  Burnard Leigh [phone]  Horton Finer [phone]  Everardo Beals [phone]   [facility] ([address] [place]) - [phone]  [facility]) - [facility] - [phone]  [facility] [place] - [phone]  [facility]  - [phone] (do not file insurance)      Complicated Grieving Grief is a normal response to the death of someone close to you. Feelings of fear, anger, and guilt can affect almost everyone who loses a loved one. It is also common to have symptoms of depression while you are grieving. These include problems with sleep, loss of appetite, and lack of energy. They may last for weeks or months after a loss. Complicated grief is different from normal grief or depression. Normal grieving involves sadness and feelings of loss, but these feelings are not constant. Complicated grief is a constant and severe type of grief. It interferes with your ability to function normally. It may last for several months to a year or longer. Complicated grief may require treatment from a mental health care provider. What are the causes? It is not known why some people continue to struggle with grief and others do not. You may be at higher risk for complicated grief if:  The death of your loved one was sudden or unexpected.  The death of your loved one was due to a violent event.  Your loved one committed suicide.  Your loved one was a child or a young person.  You were very close to or dependent on the loved one.  You have a history of depression.  What are the signs or symptoms? Signs and symptoms of complicated grief may include:  Feeling disbelief or numbness.  Being unable to enjoy good memories of your  loved one.  Needing to avoid anything that reminds you of your loved one.  Being unable to stop thinking about the death.  Feeling intense anger or guilt.  Feeling alone and hopeless.  Feeling that your life is meaningless and empty.  Losing the desire to live.  How is this diagnosed? Your health care provider may diagnose complicated grief if:  You have constant symptoms of grief for 6-12 months or longer.  Your symptoms are interfering with your ability to live your life.  Your health care provider may want you to see a mental health care provider. Many symptoms of depression are similar to the symptoms of complicated grief. It is important to be evaluated for complicated grief along with other mental health conditions. How is this treated? Talk therapy with a mental health provider is the most common treatment for complicated grief. During therapy, you will learn healthy ways to cope with the loss of your loved one. In some cases, your mental health care provider may also recommend antidepressant medicines. Follow these instructions at home:  Take care of yourself. ? Eat regular meals and maintain a healthy diet. Eat plenty of fruits, vegetables, and whole grains. ? Try to get some exercise each day. ? Keep regular hours for sleep. Try to get at least 8 hours of sleep each night.  Do not use drugs or alcohol to ease your symptoms.  Take medicines only as directed by your health care provider.  Spend time with friends and loved ones.  Consider joining a grief (bereavement) support group to help you deal  with your loss.  Keep all follow-up visits as directed by your health care provider. This is important. Contact a health care provider if:  Your symptoms keep you from functioning normally.  Your symptoms do not get better with treatment. Get help right away if:  You have serious thoughts of hurting yourself or someone else.  You have suicidal feelings. This  information is not intended to replace advice given to you by your health care provider. Make sure you discuss any questions you have with your health care provider. Document Released: 08/31/2005 Document Revised: 02/06/2016 Document Reviewed: 02/08/2014 Elsevier Interactive Patient Education  [employer]2018 Elsevier Inc.

## 2018-03-24 NOTE — Progress Notes (Signed)
    MRN: [id number] DOB: [birth year]  Subjective:   Brent Lee is a 32 y.o. male presenting for recheck on depression, grieving. He needs to have his FMLA reviewed. It was denied and is requesting a revision. He states he feels a little better. However, still feels like he is grieving, has decreased energy, feelings of guilt, decreased appetite. He did not like Prozac. States it made him feel weird. He is finished with Xanax, did not use it daily. He is talking with a counselor at work. 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 No SI, [place]. He  reports that he has never smoked. He has never used smokeless tobacco. He reports that he drinks alcohol. He reports that he has current or past drug history. Drug: Marijuana. Smokes marijuana ~1 time per week.   Ronak has a current medication list which includes the following prescription(s): alprazolam and fluoxetine. Also has No Known Allergies.  Brent Lee  has a past medical history of Depression. Denies past surgical history.   Objective:   Vitals: BP 109/73 (BP Location: Left Arm, Patient Position: Sitting, Cuff Size: Large)   Pulse 61   Temp 99.1 F (37.3 C) (Oral)   Resp 16   Ht 5\' 9"  (1.753 m)   Wt 170 lb (77.1 kg)   SpO2 100%   BMI 25.10 kg/m   Physical Exam  Constitutional: He is oriented to person, place, and time. He appears well-developed and well-nourished.  Cardiovascular: Normal rate.  Pulmonary/Chest: Effort normal.  Neurological: He is alert and oriented to person, place, and time.  Psychiatric: His mood appears not anxious. His affect is not labile. His speech is not rapid and/or pressured, not delayed, not tangential and not slurred. Thought content is not delusional.  Flat affect. Not suicidal, homicidal.   Assessment and Plan :   Grieving  Death of family member  Depression, unspecified depression type  Stop Prozac. Start Zoloft. FMLA revised. Will  recheck in 4 weeks. ER precautions reviewed.  Wallis BambergMario Breelyn Icard, PA-C [facility] [phone] 03/24/2018  2:49 PM

## 2018-05-19 DIAGNOSIS — Z0271 Encounter for disability determination: Secondary | ICD-10-CM

## 2018-06-07 ENCOUNTER — Ambulatory Visit: Payer: BLUE CROSS/BLUE SHIELD | Admitting: Urgent Care
